# Patient Record
Sex: Male | Born: 2000 | Race: Black or African American | Hispanic: No | Marital: Single | Smoking: Never smoker
Health system: Southern US, Community
[De-identification: ages and names within clinical notes are randomized; demographics above are authoritative.]

---
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
## 2022-01-02 ENCOUNTER — Ambulatory Visit (HOSPITAL_COMMUNITY): Payer: Medicaid Other | Attending: Orthopedic Surgery | Admitting: Occupational Therapy

## 2022-01-02 ENCOUNTER — Encounter (HOSPITAL_COMMUNITY): Payer: Self-pay | Admitting: Occupational Therapy

## 2022-01-02 DIAGNOSIS — R29898 Other symptoms and signs involving the musculoskeletal system: Secondary | ICD-10-CM | POA: Diagnosis not present

## 2022-01-02 DIAGNOSIS — M25512 Pain in left shoulder: Secondary | ICD-10-CM | POA: Diagnosis not present

## 2022-01-02 DIAGNOSIS — M25612 Stiffness of left shoulder, not elsewhere classified: Secondary | ICD-10-CM | POA: Diagnosis not present

## 2022-01-02 NOTE — Therapy (Signed)
OUTPATIENT OCCUPATIONAL THERAPY ORTHO EVALUATION  Patient Name: Jack Horn MRN: [id number] DOB:Nov 09, 2000, 21 y.o., male Today's Date: 01/02/2022  PCP: Dr. Johny Drilling REFERRING PROVIDER: Dr. Fuller Canada   OT End of Session - 01/02/22 1047     Visit Number 1    Number of Visits 8    Date for OT Re-Evaluation 02/01/22    Authorization Type [place] Medicaid    Authorization Time Period requesting 8 visits    Authorization - Visit Number 0    Authorization - Number of Visits 8    OT Start Time [phone]    OT Stop Time 1026    OT Time Calculation (min) 38 min    Activity Tolerance Patient tolerated treatment well    Behavior During Therapy [facility] for tasks assessed/performed             History reviewed. No pertinent past medical history. History reviewed. No pertinent surgical history. There are no problems to display for this patient.   ONSET DATE: 12/07/21  REFERRING DIAG: s/p closed dislocation of left shoulder  THERAPY DIAG:  Acute pain of left shoulder  Stiffness of left shoulder, not elsewhere classified  Other symptoms and signs involving the musculoskeletal system  Rationale for Evaluation and Treatment Rehabilitation  SUBJECTIVE:   SUBJECTIVE STATEMENT: S: I got out of the sling two days ago and now I have this brace to wear if I'm doing exercises.  Pt accompanied by: self  PERTINENT HISTORY: Pt is a 21 y/o male s/p left shoulder dislocation that he sustained on 6/17 while at football camp at the [facility], it was reduced on the field by an Event organiser. Pt has been in a sling for 4 weeks, has not attempted to use his LUE for anything other than waist height tasks.   PRECAUTIONS: None *Note: avoid abducted er/IR*  WEIGHT BEARING RESTRICTIONS No  PAIN:  Are you having pain? Yes: NPRS scale: 7/10 Pain location: posterior shoulder Pain description: sharp Aggravating factors: laying on back Relieving factors: ice  FALLS: Has patient  fallen in last 6 months? No   PLOF: Independent  PATIENT GOALS To return to football.  OBJECTIVE:   HAND DOMINANCE: Left  ADLs: Overall ADLs: Pt reports he has not tried to reach behind his back, is completing tasks at waist level. 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 Pt is having difficulty with functional reaching tasks, overhead reaching and reaching behind back. Pt is unable to complete tasks requiring force through his LUE. Pt noted to have moderate fascial restrictions in upper trapezius and scapular regions.    FUNCTIONAL OUTCOME MEASURES: Quick Dash: 45.45   UPPER EXTREMITY ROM     Active ROM Left eval  Shoulder flexion 116  Shoulder abduction 74  Shoulder internal rotation 90  Shoulder external rotation 60  (Blank rows = not tested)      Passive ROM Left eval  Shoulder flexion 134  Shoulder abduction 133  Shoulder internal rotation 90  Shoulder external rotation 75  (Blank rows = not tested)  UPPER EXTREMITY MMT:     MMT Left eval  Shoulder flexion 4-/5  Shoulder abduction 3-/5  Shoulder internal rotation 5/5  Shoulder external rotation 4/5  (Blank rows = not tested)  HAND FUNCTION: Grip strength: Right: 115 lbs; Left: 65 lbs    COGNITION: Overall cognitive status: Within functional limits for tasks assessed      PATIENT EDUCATION: Education details: AA/ROM and A/ROM for the shoulder, green theraputty  for grip strengthening Person educated: Patient Education method: Explanation, Demonstration, and Handouts Education comprehension: verbalized understanding and returned demonstration   HOME EXERCISE PROGRAM: Eval: AA/ROM-flexion/abduction/horizontal abduction, A/ROM protraction/er, green theraputty grip strengthening  GOALS: Goals reviewed with patient? Yes  SHORT TERM GOALS: Target date: 01/30/2022    Pt will be provided with and educated on HEP to improve ability to use LUE as dominant during ADL and leisure tasks.    Goal status: INITIAL  2.  Pt will decrease pain in LUE to 3/10 or less to improve ability to sleep in position of comfort for 3+ hours without waking due to pain.   Goal status: INITIAL  3.  Pt will decrease fascial restrictions from mod to min amounts or less to improve mobility required for functional reaching tasks.   Goal status: INITIAL  4.  Pt will increase LUE A/ROM to WNL to improve mobility required for throwing and football.   Goal status: INITIAL  5.  Pt will increase LUE strength to 5/5 or greater to improve ability to perform receiver and corner tasks during football with least risk of re-injury.   Goal status: INITIAL    ASSESSMENT:  CLINICAL IMPRESSION: Patient is a 21 y.o. male who was seen today for occupational therapy evaluation s/p LUE dislocation.    PERFORMANCE DEFICITS in functional skills including ADLs, IADLs, ROM, strength, pain, fascial restrictions, muscle spasms, endurance, and UE functional use  IMPAIRMENTS are limiting patient from ADLs, IADLs, rest and sleep, and leisure.   COMORBIDITIES has no other co-morbidities that affects occupational performance. Patient will benefit from skilled OT to address above impairments and improve overall function.  MODIFICATION OR ASSISTANCE TO COMPLETE EVALUATION: No modification of tasks or assist necessary to complete an evaluation.  OT OCCUPATIONAL PROFILE AND HISTORY: Problem focused assessment: Including review of records relating to presenting problem.  CLINICAL DECISION MAKING: LOW - limited treatment options, no task modification necessary  REHAB POTENTIAL: Excellent  EVALUATION COMPLEXITY: Low      PLAN: OT FREQUENCY: 2x/week  OT DURATION: 4 weeks  PLANNED INTERVENTIONS: self care/ADL training, therapeutic exercise, therapeutic activity, passive range of motion, electrical stimulation, ultrasound, moist heat, cryotherapy, and patient/family education   CONSULTED AND AGREED WITH PLAN OF  CARE: Patient  PLAN FOR NEXT SESSION: Follow up on HEP, manual techniques/trigger point release at trapezius/scapular region, P/ROM, progress to all A/ROM if able   [employer], OTR/L  [phone] 01/02/2022, 10:48 AM

## 2022-01-02 NOTE — Patient Instructions (Signed)
Perform each exercise ____10-15____ reps. 2-3x days.   1) Shoulder FLEXION   In the standing position, hold a wand/cane with both arms, palms down on both sides. Raise up the wand/cane allowing your unaffected arm to perform most of the effort. Your affected arm should be partially relaxed.         2) Shoulder ABDUCTION   While holding a wand/cane palm face up on the injured side and palm face down on the uninjured side, slowly raise up your injured arm to the side.        3) Horizontal Abduction/Adduction   Straight arms holding cane at shoulder height, bring cane to right, center, left. Repeat starting to left.   4) Shoulder Protraction    Begin with elbows by your side, slowly "punch" straight out in front of you.       5) Internal & External Rotation  Standing:     Stand with elbows at the side and elbows bent 90 degrees. Move your forearms away from your body, then bring back inward toward the body.            Home Exercises Program Theraputty Exercises  Do the following exercises 2 times a day using your affected hand.  1. Roll putty into a ball.  2. Make into a pancake.  3. Roll putty into a roll.  4. Pinch along log with first finger and thumb.   5. Make into a ball.  6. Roll it back into a log.   7. Pinch using thumb and side of first finger.  8. Roll into a ball, then flatten into a pancake.  9. Using your fingers, make putty into a mountain.  10. Roll putty back into a ball and squeeze gently for 2-3 minutes.

## 2022-01-06 ENCOUNTER — Ambulatory Visit (HOSPITAL_COMMUNITY): Payer: Medicaid Other | Attending: Pediatrics

## 2022-01-06 ENCOUNTER — Telehealth (HOSPITAL_COMMUNITY): Payer: Self-pay

## 2022-01-06 DIAGNOSIS — M25612 Stiffness of left shoulder, not elsewhere classified: Secondary | ICD-10-CM | POA: Insufficient documentation

## 2022-01-06 DIAGNOSIS — R29898 Other symptoms and signs involving the musculoskeletal system: Secondary | ICD-10-CM | POA: Insufficient documentation

## 2022-01-06 DIAGNOSIS — M25512 Pain in left shoulder: Secondary | ICD-10-CM | POA: Insufficient documentation

## 2022-01-06 NOTE — Telephone Encounter (Signed)
Called both numbers listed regarding no show apt. with no answer and unable to leave voicemail.

## 2022-01-08 ENCOUNTER — Encounter (HOSPITAL_COMMUNITY): Payer: Self-pay

## 2022-01-08 ENCOUNTER — Ambulatory Visit (HOSPITAL_COMMUNITY): Payer: Medicaid Other

## 2022-01-08 DIAGNOSIS — M25512 Pain in left shoulder: Secondary | ICD-10-CM | POA: Diagnosis not present

## 2022-01-08 DIAGNOSIS — M25612 Stiffness of left shoulder, not elsewhere classified: Secondary | ICD-10-CM | POA: Diagnosis not present

## 2022-01-08 DIAGNOSIS — R29898 Other symptoms and signs involving the musculoskeletal system: Secondary | ICD-10-CM

## 2022-01-08 NOTE — Therapy (Signed)
OUTPATIENT OCCUPATIONAL THERAPY TREATMENT NOTE   Patient Name: Jack Horn MRN: [id number] DOB:05-13-2001, 21 y.o., male Today's Date: 01/08/2022  PCP: Dr. Johny Drilling REFERRING PROVIDER: Dr. Fuller Canada   OT End of Session - 01/08/22 1033     Visit Number 2    Number of Visits 8    Date for OT Re-Evaluation 02/01/22    Authorization Type HB Medicaid    Authorization Time Period requesting 8 visits    Authorization - Visit Number 1    Authorization - Number of Visits 8    OT Start Time 0950    OT Stop Time 1030    OT Time Calculation (min) 40 min    Activity Tolerance Patient tolerated treatment well    Behavior During Therapy [facility] for tasks assessed/performed             History reviewed. No pertinent past medical history. History reviewed. No pertinent surgical history. There are no problems to display for this patient.   ONSET DATE: 12/07/21  REFERRING DIAG: s/p closed dislocation of left shoulder  THERAPY DIAG:  Acute pain of left shoulder  Stiffness of left shoulder, not elsewhere classified  Other symptoms and signs involving the musculoskeletal system  Rationale for Evaluation and Treatment Rehabilitation  PERTINENT HISTORY: Pt is a 21 y/o male s/p left shoulder dislocation that he sustained on 6/17 while at football camp at the [facility], it was reduced on the field by an Event organiser. Pt has been in a sling for 4 weeks, has not attempted to use his LUE for anything other than waist height tasks.   PRECAUTIONS: None *Note: avoid abducted er/IR*  SUBJECTIVE: S: It feels pretty good, just weak. I am wearing my sling about 50% of the day.  PAIN:  Are you having pain? Yes: NPRS scale: 1/10 Pain location: anterior shoulder Pain description: dull ache Aggravating factors: none Relieving factors: exercises     OBJECTIVE:   HAND DOMINANCE: Left   ADLs: Overall ADLs: Pt reports he has not tried to reach behind his back, is  completing tasks at waist level. 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 Pt is having difficulty with functional reaching tasks, overhead reaching and reaching behind back. Pt is unable to complete tasks requiring force through his LUE. Pt noted to have moderate fascial restrictions in upper trapezius and scapular regions.      FUNCTIONAL OUTCOME MEASURES: Quick Dash: 45.45     UPPER EXTREMITY ROM      Active ROM Left eval  Shoulder flexion 116  Shoulder abduction 74  Shoulder internal rotation 90  Shoulder external rotation 60  (Blank rows = not tested)                              Passive ROM Left eval  Shoulder flexion 134  Shoulder abduction 133  Shoulder internal rotation 90  Shoulder external rotation 75  (Blank rows = not tested)   UPPER EXTREMITY MMT:      MMT Left eval  Shoulder flexion 4-/5  Shoulder abduction 3-/5  Shoulder internal rotation 5/5  Shoulder external rotation 4/5  (Blank rows = not tested)   HAND FUNCTION: Grip strength: Right: 115 lbs; Left: 65 lbs      GOALS: Goals reviewed with patient? Yes   SHORT TERM GOALS: Target date: 01/30/2022     Pt will be provided with and educated on HEP to improve  ability to use LUE as dominant during ADL and leisure tasks.    Goal status: INITIAL   2.  Pt will decrease pain in LUE to 3/10 or less to improve ability to sleep in position of comfort for 3+ hours without waking due to pain.    Goal status: INITIAL   3.  Pt will decrease fascial restrictions from mod to min amounts or less to improve mobility required for functional reaching tasks.    Goal status: INITIAL   4.  Pt will increase LUE A/ROM to WNL to improve mobility required for throwing and football.    Goal status: INITIAL   5.  Pt will increase LUE strength to 5/5 or greater to improve ability to perform receiver and corner tasks during football with least risk of re-injury.    Goal status: INITIAL      TODAY'S  TREATMENT:   -Manual Therapy: Trigger point, soft tissue mobilization, and myofascial release completed to the upper trapezius, posterior scapula, and scalenes -P/ROM: 1x10 shoulder flexion, abduction, IR/er, horizontal abduction -AA/ROM: 1x10 shoulder flexion, abduction IR/er -Scapular ROM: 1x10 standing row, extension, retraction, and Y standing facing the wall -Scapular ROM: 1x10, standing at wall with shoulders at 90 degrees, alternating between protraction and retraction.  -Shoulder strengthening: Pt standing, holding green exercise ball, completing movements with therapist providing slight resistance to the therapy ball. 1x10 flexion and abduction.  -Shoulder stabilization: 1x30 second plank   PATIENT EDUCATION: Education details: scapular ROM  Person educated: Patient Education method: Explanation, Demonstration, and Handouts Education comprehension: verbalized understanding and returned demonstration     HOME EXERCISE PROGRAM: Eval: AA/ROM-flexion/abduction/horizontal abduction, A/ROM protraction/er, green theraputty grip strengthening    ASSESSMENT:   CLINICAL IMPRESSION: A:Pt presenting with improved shoulder passive and active range of motion from evaluation date, reporting no pain with movement, some slight discomfort. Fluid scapular movements noted with moderate initial tactile cues required for proper muscle recruitment with scapular range of motion exercises. Pt reports that his shoulder stabilizer muscles feel weak but not painful. Continue to progress to strengthening as tolerated to stabilize musculature surrounding joint.    PLAN:   OT FREQUENCY: 2x/week  OT DURATION: 4 weeks  PLANNED INTERVENTIONS: self care/ADL training, therapeutic exercise, therapeutic activity, passive range of motion, electrical stimulation, ultrasound, moist heat, cryotherapy, and patient/family education  RECOMMENDED OTHER SERVICES: Skilled OT services are warranted to address decreased  ROM and strength impacting the pt's ability to complete ADLs and IADLs at his PLOF.   CONSULTED AND AGREED WITH PLAN OF CARE: Patient  PLAN FOR NEXT SESSION: P: Progress to strengthening    Perley Jain, OTD, OTR/L [phone]  01/08/2022, 11:25 AM

## 2022-01-14 ENCOUNTER — Ambulatory Visit (HOSPITAL_COMMUNITY): Payer: Medicaid Other

## 2022-01-14 ENCOUNTER — Encounter (HOSPITAL_COMMUNITY): Payer: Self-pay

## 2022-01-14 DIAGNOSIS — R29898 Other symptoms and signs involving the musculoskeletal system: Secondary | ICD-10-CM

## 2022-01-14 DIAGNOSIS — M25612 Stiffness of left shoulder, not elsewhere classified: Secondary | ICD-10-CM

## 2022-01-14 DIAGNOSIS — M25512 Pain in left shoulder: Secondary | ICD-10-CM

## 2022-01-14 NOTE — Therapy (Signed)
OUTPATIENT OCCUPATIONAL THERAPY TREATMENT NOTE   Patient Name: Jack Horn MRN: [id number] DOB:01/07/2001, 21 y.o., male Today's Date: 01/14/2022  PCP: Dr. Johny Drilling REFERRING PROVIDER: Dr. Fuller [place]   OT End of Session - 01/14/22 1029     Visit Number 3    Number of Visits 8    Date for OT Re-Evaluation 02/01/22    Authorization Type HB Medicaid    Authorization Time Period requesting 8 visits    Authorization - Visit Number 2    Authorization - Number of Visits 8    OT Start Time 1027    OT Stop Time 1111    OT Time Calculation (min) 44 min    Activity Tolerance Patient tolerated treatment well    Behavior During Therapy [facility] for tasks assessed/performed             History reviewed. No pertinent past medical history. History reviewed. No pertinent surgical history. There are no problems to display for this patient.   ONSET DATE: 12/07/21  REFERRING DIAG: s/p closed dislocation of left shoulder  THERAPY DIAG:  Acute pain of left shoulder  Stiffness of left shoulder, not elsewhere classified  Other symptoms and signs involving the musculoskeletal system  Rationale for Evaluation and Treatment Rehabilitation  PERTINENT HISTORY: Pt is a 21 y/o male s/p left shoulder dislocation that he sustained on 6/17 while at football camp at the [facility], it was reduced on the field by an Event organiser. Pt has been in a sling for 4 weeks, has not attempted to use his LUE for anything other than waist height tasks.   PRECAUTIONS: None *Note: avoid abducted er/IR*  SUBJECTIVE: S: "It is feeling better. No real pain."  PAIN:  Are you having pain? No    OBJECTIVE:   HAND DOMINANCE: Left   ADLs: Overall ADLs: Pt reports he has not tried to reach behind his back, is completing tasks at waist level. 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 Pt is having difficulty with functional reaching tasks, overhead reaching and reaching behind  back. Pt is unable to complete tasks requiring force through his LUE. Pt noted to have moderate fascial restrictions in upper trapezius and scapular regions.      FUNCTIONAL OUTCOME MEASURES: Quick Dash: 45.45     UPPER EXTREMITY ROM      Active ROM Left eval  Shoulder flexion 116  Shoulder abduction 74  Shoulder internal rotation 90  Shoulder external rotation 60  (Blank rows = not tested)                              Passive ROM Left eval  Shoulder flexion 134  Shoulder abduction 133  Shoulder internal rotation 90  Shoulder external rotation 75  (Blank rows = not tested)   UPPER EXTREMITY MMT:      MMT Left eval  Shoulder flexion 4-/5  Shoulder abduction 3-/5  Shoulder internal rotation 5/5  Shoulder external rotation 4/5  (Blank rows = not tested)   HAND FUNCTION: Grip strength: Right: 115 lbs; Left: 65 lbs      GOALS: Goals reviewed with patient? Yes   SHORT TERM GOALS: Target date: 01/30/2022     Pt will be provided with and educated on HEP to improve ability to use LUE as dominant during ADL and leisure tasks.    Goal status: INITIAL   2.  Pt will decrease pain in LUE  to 3/10 or less to improve ability to sleep in position of comfort for 3+ hours without waking due to pain.    Goal status: INITIAL   3.  Pt will decrease fascial restrictions from mod to min amounts or less to improve mobility required for functional reaching tasks.    Goal status: INITIAL   4.  Pt will increase LUE A/ROM to WNL to improve mobility required for throwing and football.    Goal status: INITIAL   5.  Pt will increase LUE strength to 5/5 or greater to improve ability to perform receiver and corner tasks during football with least risk of re-injury.    Goal status: INITIAL      TODAY'S TREATMENT:    01/14/22 -Manual Therapy: Trigger point targeting medial border of scapula and upper trapezius  -Shoulder/Scapular Strengthening  -1x10 exercise ball on wall (up,  down, left, right)  -1x10 exercise ball on wall (CW and CCW)  -2x10 standing IR/er 2#  -1x10 arnold press 4#  -1x10 wall angels with bodyweight, 1x10 2# -2x10 red theraband loop forward flexion, overhead scapular downward rotation facing wall -1x10 red theraband standing row, extension  -1x10 red theraband standing forward flexion, abduction, chest pull, FNF D2 flexion -1x5 shoulder isometrics (flexion, extension, abduction) 5 second hold   01/08/22 -Manual Therapy: Trigger point, soft tissue mobilization, and myofascial release completed to the upper trapezius, posterior scapula, and scalenes -P/ROM: 1x10 shoulder flexion, abduction, IR/er, horizontal abduction -AA/ROM: 1x10 shoulder flexion, abduction IR/er -Scapular ROM: 1x10 standing row, extension, retraction, and Y standing facing the wall -Scapular ROM: 1x10, standing at wall with shoulders at 90 degrees, alternating between protraction and retraction.  -Shoulder strengthening: Pt standing, holding green exercise ball, completing movements with therapist providing slight resistance to the therapy ball. 1x10 flexion and abduction.  -Shoulder stabilization: 1x30 second plank   PATIENT EDUCATION: Education details: TheraBand shoulder and scapular strengthening  Person educated: Patient Education method: Explanation, Demonstration, and Handouts Education comprehension: verbalized understanding and returned demonstration     HOME EXERCISE PROGRAM: Eval: AA/ROM-flexion/abduction/horizontal abduction, A/ROM protraction/er, green theraputty grip strengthening 01/14/22: Red theraband scapular and shoulder strengthening     ASSESSMENT:   CLINICAL IMPRESSION: A:Pt presenting with no shoulder pain and ROM WNL. Trigger point manual therapy to address restrictions along the medial border of the scapula. Pt required moderate tactile cuing for form with exercises, emphasizing form over speed and resistance. Pt reporting no pain with  movements but moderate fatigue. Scapular imbalances noted with exercises, therapist cuing for depression of left shoulder blade to minimize compensations.   PLAN:   OT FREQUENCY: 2x/week  OT DURATION: 4 weeks  PLANNED INTERVENTIONS: self care/ADL training, therapeutic exercise, therapeutic activity, passive range of motion, electrical stimulation, ultrasound, moist heat, cryotherapy, and patient/family education  RECOMMENDED OTHER SERVICES: Skilled OT services are warranted to address decreased ROM and strength impacting the pt's ability to complete ADLs and IADLs at his PLOF.   CONSULTED AND AGREED WITH PLAN OF CARE: Patient  PLAN FOR NEXT SESSION: P: arm bike, plank    Perley Jain, [place], OTR/L [phone]  01/14/2022, 11:27 AM

## 2022-01-14 NOTE — Patient Instructions (Signed)
  1) Shoulder flexion  While standing with back to the door, holding Theraband at hand level, raise arm in front of you.  Keep elbow straight through entire movement.      2) Shoulder abduction  While holding an elastic band at your side, draw up your arm to the side keeping your elbow straight.    3) Strengthening: Chest Pull - Resisted   Hold Theraband in front of body with hands about shoulder width a part. Pull band a part and back together slowly. Repeat ____ times. Complete ____ set(s) per session.. Repeat ____ session(s) per day.  http://orth.exer.us/926   Copyright  VHI. All rights reserved.   4) PNF Strengthening: Resisted   Standing with resistive band around each hand, bring right arm up and away, thumb back. Repeat ____ times per set. Do ____ sets per session. Do ____ sessions per day.   Shoulder Isometric Exercises  Complete 5x each, holding for 5 seconds. Complete 2-3x/day.   1) Shoulder Flexion Standing up, place a towel between your arm/wrist and the wall. Press your arm forward into the wall gently, without moving your shoulder.     2) Shoulder Extension Standing with your back against a wall. Bend elbow to 90 degrees and place a towel between your bent elbow and the wall.  Press your elbow back into the wall gently, without moving your shoulder.      3) Shoulder Abduction Standing to the side of a wall, place a towel between your arm and the wall.  Press your arm into the wall gently.  Do not move your shoulder.        4) (Clinic) Extension / Flexion (Assist)   Face anchor, pull arms back, keeping elbow straight, and squeze shoulder blades together. Repeat 10-15 times. 1-3 times/day.   Copyright  VHI. All rights reserved.   5) (Home) Retraction: Row - Bilateral (Anchor)   Facing anchor, arms reaching forward, pull hands toward stomach, keeping elbows bent and at your sides and pinching shoulder blades together. Repeat 10-15 times.  1-3 times/day.   Copyright  VHI. All rights reserved.

## 2022-01-16 ENCOUNTER — Telehealth (HOSPITAL_COMMUNITY): Payer: Self-pay | Admitting: Occupational Therapy

## 2022-01-16 ENCOUNTER — Encounter (HOSPITAL_COMMUNITY): Payer: Medicaid Other | Admitting: Occupational Therapy

## 2022-01-16 NOTE — Telephone Encounter (Signed)
Attempted to contact pt regarding no show for today. No answer, voicemail not set up and unable to leave a message.    Ezra Sites, OTR/L  [phone] 01/16/22

## 2022-01-21 ENCOUNTER — Telehealth (HOSPITAL_COMMUNITY): Payer: Self-pay

## 2022-01-21 ENCOUNTER — Encounter (HOSPITAL_COMMUNITY): Payer: Medicaid Other

## 2022-01-21 NOTE — Telephone Encounter (Signed)
Called pt regarding no show apt. No answer, unable to leave a voicemail.

## 2022-01-23 ENCOUNTER — Encounter (HOSPITAL_COMMUNITY): Payer: Self-pay

## 2022-01-23 ENCOUNTER — Ambulatory Visit (HOSPITAL_COMMUNITY): Payer: Medicaid Other | Attending: Pediatrics

## 2022-01-23 DIAGNOSIS — M25612 Stiffness of left shoulder, not elsewhere classified: Secondary | ICD-10-CM | POA: Diagnosis not present

## 2022-01-23 DIAGNOSIS — M25512 Pain in left shoulder: Secondary | ICD-10-CM

## 2022-01-23 DIAGNOSIS — R29898 Other symptoms and signs involving the musculoskeletal system: Secondary | ICD-10-CM

## 2022-01-23 NOTE — Patient Instructions (Signed)
1) (Home) Extension: Isometric / Bilateral Arm Retraction - Sitting   Facing anchor, hold hands and elbow at shoulder height, with elbow bent.  Pull arms back to squeeze shoulder blades together. Repeat 10-15 times. 1-3 times/day.   2) (Clinic) Extension / Flexion (Assist)   Face anchor, pull arms back, keeping elbow straight, and squeze shoulder blades together. Repeat 10-15 times. 1-3 times/day.   Copyright  VHI. All rights reserved.   3) (Home) Retraction: Row - Bilateral (Anchor)   Facing anchor, arms reaching forward, pull hands toward stomach, keeping elbows bent and at your sides and pinching shoulder blades together. Repeat 10-15 times. 1-3 times/day.   Copyright  VHI. All rights reserved.    Theraband strengthening: Complete 10-15X, 1-2X/day   1) Shoulder Internal Rotation  While holding an elastic band at your side with your elbow bent, start with your hand away from your stomach, then pull the band towards your stomach. Keep your elbow near your side the entire time.     2) Shoulder External Rotation  While holding an elastic band at your side with your elbow bent, start with your hand near your stomach and then pull the band away. Keep your elbow at your side the entire time.     3) Shoulder flexion  While standing with back to the door, holding Theraband at hand level, raise arm in front of you.  Keep elbow straight through entire movement.      4) Shoulder abduction  While holding an elastic band at your side, draw up your arm to the side keeping your elbow straight.    1) Strengthening: Chest Pull - Resisted   Hold Theraband in front of body with hands about shoulder width a part. Pull band a part and back together slowly. Repeat ____ times. Complete ____ set(s) per session.. Repeat ____ session(s) per day.  http://orth.exer.us/926   Copyright  VHI. All rights reserved.   2) PNF Strengthening: Resisted   Standing with resistive band  around each hand, bring right arm up and away, thumb back. Repeat ____ times per set. Do ____ sets per session. Do ____ sessions per day.

## 2022-01-23 NOTE — Therapy (Signed)
OUTPATIENT OCCUPATIONAL THERAPY TREATMENT NOTE   Patient Name: Jack Horn MRN: [id number] DOB:21-Aug-2000, 21 y.o., male Today's Date: 01/23/2022  PCP: Dr. Johny Drilling REFERRING PROVIDER: Dr. Fuller Canada   OT End of Session - 01/23/22 1147     Visit Number 4    Number of Visits 8    Date for OT Re-Evaluation 02/01/22    Authorization Type HB Medicaid    Authorization Time Period requesting 8 visits    Authorization - Visit Number 3    Authorization - Number of Visits 8    OT Start Time 1043    OT Stop Time 1121    OT Time Calculation (min) 38 min    Activity Tolerance Patient tolerated treatment well    Behavior During Therapy [facility] for tasks assessed/performed              History reviewed. No pertinent past medical history. History reviewed. No pertinent surgical history. There are no problems to display for this patient.   ONSET DATE: 12/07/21  REFERRING DIAG: s/p closed dislocation of left shoulder  THERAPY DIAG:  Acute pain of left shoulder  Stiffness of left shoulder, not elsewhere classified  Other symptoms and signs involving the musculoskeletal system  Rationale for Evaluation and Treatment Rehabilitation  PERTINENT HISTORY: Pt is a 21 y/o male s/p left shoulder dislocation that he sustained on 6/17 while at football camp at the [facility], it was reduced on the field by an Event organiser. Pt has been in a sling for 4 weeks, has not attempted to use his LUE for anything other than waist height tasks.   PRECAUTIONS: None *Note: avoid abducted er/IR*  SUBJECTIVE: S: "I feel like it is getting stronger."   PAIN:  Are you having pain? No    Some discomfort at the posterior shoulder   OBJECTIVE:   HAND DOMINANCE: Left   ADLs: Overall ADLs: Pt reports he has not tried to reach behind his back, is completing tasks at waist level. 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 Pt is having difficulty with functional reaching  tasks, overhead reaching and reaching behind back. Pt is unable to complete tasks requiring force through his LUE. Pt noted to have moderate fascial restrictions in upper trapezius and scapular regions.      FUNCTIONAL OUTCOME MEASURES: Quick Dash: 45.45     UPPER EXTREMITY ROM      Active ROM Left eval  Shoulder flexion 116  Shoulder abduction 74  Shoulder internal rotation 90  Shoulder external rotation 60  (Blank rows = not tested)                              Passive ROM Left eval  Shoulder flexion 134  Shoulder abduction 133  Shoulder internal rotation 90  Shoulder external rotation 75  (Blank rows = not tested)   UPPER EXTREMITY MMT:      MMT Left eval  Shoulder flexion 4-/5  Shoulder abduction 3-/5  Shoulder internal rotation 5/5  Shoulder external rotation 4/5  (Blank rows = not tested)   HAND FUNCTION: Grip strength: Right: 115 lbs; Left: 65 lbs      GOALS: Goals reviewed with patient? Yes   SHORT TERM GOALS: Target date: 01/30/2022     Pt will be provided with and educated on HEP to improve ability to use LUE as dominant during ADL and leisure tasks.    Goal status: ongoing  2.  Pt will decrease pain in LUE to 3/10 or less to improve ability to sleep in position of comfort for 3+ hours without waking due to pain.    Goal status: ongoing    3.  Pt will decrease fascial restrictions from mod to min amounts or less to improve mobility required for functional reaching tasks.    Goal status: ongoing    4.  Pt will increase LUE A/ROM to WNL to improve mobility required for throwing and football.    Goal status: ongoing    5.  Pt will increase LUE strength to 5/5 or greater to improve ability to perform receiver and corner tasks during football with least risk of re-injury.    Goal status: ongoing       TODAY'S TREATMENT:     01/23/22 -Manual Therapy: Trigger point targeting medial border of scapula and upper trapezius  -Shoulder/Scapular  Strengthening -1x30" exercise ball stabilization. Pt holding exercise ball in forward flexion, 90 degrees, therapist moving ball in various directions with pt instructed to hold ball still   -2x10 standing IR/er 4#  -1x10 arnold press 4# -1x10 red theraband standing chest pull, forward flexion with tension -Body blade, 45 sec, elbow in flexion, elbow in extension, forward flexion, abduction     -Plank with crossing midline to retrieve 4# weight, 2x30 sec   01/14/22 -Manual Therapy: Trigger point targeting medial border of scapula and upper trapezius  -Shoulder/Scapular Strengthening  -1x10 exercise ball on wall (up, down, left, right)  -1x10 exercise ball on wall (CW and CCW)  -2x10 standing IR/er 2#  -1x10 arnold press 4#  -1x10 wall angels with bodyweight, 1x10 2# -2x10 red theraband loop forward flexion, overhead scapular downward rotation facing wall -1x10 red theraband standing row, extension  -1x10 red theraband standing forward flexion, abduction, chest pull, FNF D2 flexion -1x5 shoulder isometrics (flexion, extension, abduction) 5 second hold  01/08/22 -Manual Therapy: Trigger point, soft tissue mobilization, and myofascial release completed to the upper trapezius, posterior scapula, and scalenes -P/ROM: 1x10 shoulder flexion, abduction, IR/er, horizontal abduction -AA/ROM: 1x10 shoulder flexion, abduction IR/er -Scapular ROM: 1x10 standing row, extension, retraction, and Y standing facing the wall -Scapular ROM: 1x10, standing at wall with shoulders at 90 degrees, alternating between protraction and retraction.  -Shoulder strengthening: Pt standing, holding green exercise ball, completing movements with therapist providing slight resistance to the therapy ball. 1x10 flexion and abduction.  -Shoulder stabilization: 1x30 second plank   PATIENT EDUCATION: Education details: Reviewed HEP Person educated: Patient Education method: Explanation, Demonstration, and  Handouts Education comprehension: verbalized understanding and returned demonstration     HOME EXERCISE PROGRAM: Eval: AA/ROM-flexion/abduction/horizontal abduction, A/ROM protraction/er, green theraputty grip strengthening 01/14/22: Red theraband scapular and shoulder strengthening     ASSESSMENT:   CLINICAL IMPRESSION: A:Pt presenting with no shoulder pain but some discomfort at the posterior shoulder. Multiple knots and fascial restrictions found throughout posterior shoulder, along medial border of the scapula and upper trapezius, responding well to manual trigger point and soft tissue. Less fatigue noted with exercises today, pt able to complete with good form throughout although requiring therapeutic rest breaks. Therapist providing minimal verbal cues for keeping left shoulder level with right shoulder. Discussed attendance policy with pt, informing him that with one more no show he will need to be discharged.   PLAN:   OT FREQUENCY: 2x/week  OT DURATION: 4 weeks  PLANNED INTERVENTIONS: self care/ADL training, therapeutic exercise, therapeutic activity, passive range of motion, electrical stimulation, ultrasound, moist heat,  cryotherapy, and patient/family education  RECOMMENDED OTHER SERVICES: Skilled OT services are warranted to address decreased ROM and strength impacting the pt's ability to complete ADLs and IADLs at his PLOF.   CONSULTED AND AGREED WITH PLAN OF CARE: Patient  PLAN FOR NEXT SESSION: P: arm bike, plank, continue with stabilization exercises    Freda Jackson, OTR/L [phone]  01/23/2022, 11:48 AM

## 2022-01-28 ENCOUNTER — Ambulatory Visit (HOSPITAL_COMMUNITY): Payer: Medicaid Other

## 2022-01-28 ENCOUNTER — Encounter (HOSPITAL_COMMUNITY): Payer: Self-pay

## 2022-01-28 DIAGNOSIS — M25612 Stiffness of left shoulder, not elsewhere classified: Secondary | ICD-10-CM

## 2022-01-28 DIAGNOSIS — R29898 Other symptoms and signs involving the musculoskeletal system: Secondary | ICD-10-CM

## 2022-01-28 DIAGNOSIS — M25512 Pain in left shoulder: Secondary | ICD-10-CM | POA: Diagnosis not present

## 2022-01-28 NOTE — Therapy (Signed)
OUTPATIENT OCCUPATIONAL THERAPY TREATMENT NOTE   Patient Name: Jack Horn MRN: [id number] DOB:[birth year], 21 y.o., male Today's Date: 01/28/2022  PCP: Dr. Johny Drilling REFERRING PROVIDER: Dr. Fuller [place]   OT End of Session - 01/28/22 0947     Visit Number 5    Number of Visits 8    Date for OT Re-Evaluation 02/01/22    Authorization Type HB Medicaid    Authorization Time Period requesting 8 visits    Authorization - Visit Number 4    Authorization - Number of Visits 8    OT Start Time 0945    OT Stop Time 1025    OT Time Calculation (min) 40 min    Activity Tolerance Patient tolerated treatment well    Behavior During Therapy [facility] for tasks assessed/performed              History reviewed. No pertinent past medical history. History reviewed. No pertinent surgical history. There are no problems to display for this patient.   ONSET DATE: 12/07/21  REFERRING DIAG: s/p closed dislocation of left shoulder  THERAPY DIAG:  Acute pain of left shoulder  Stiffness of left shoulder, not elsewhere classified  Other symptoms and signs involving the musculoskeletal system  Rationale for Evaluation and Treatment Rehabilitation  PERTINENT HISTORY: Pt is a 21 y/o male s/p left shoulder dislocation that he sustained on 6/17 while at football camp at the [facility], it was reduced on the field by an Event organiser. Pt has been in a sling for 4 weeks, has not attempted to use his LUE for anything other than waist height tasks.   PRECAUTIONS: None *Note: avoid abducted er/IR*  SUBJECTIVE: S: "It has been feeling good. I can do everything that I need to do."  PAIN:  Are you having pain? No    Some discomfort at the posterior shoulder   OBJECTIVE:   HAND DOMINANCE: Left   ADLs: Overall ADLs: Pt reports he has not tried to reach behind his back, is completing tasks at waist level. 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 Pt is having  difficulty with functional reaching tasks, overhead reaching and reaching behind back. Pt is unable to complete tasks requiring force through his LUE. Pt noted to have moderate fascial restrictions in upper trapezius and scapular regions.      FUNCTIONAL OUTCOME MEASURES: Quick Dash: 45.45     UPPER EXTREMITY ROM      Active ROM Left eval  Shoulder flexion 116  Shoulder abduction 74  Shoulder internal rotation 90  Shoulder external rotation 60  (Blank rows = not tested)                              Passive ROM Left eval  Shoulder flexion 134  Shoulder abduction 133  Shoulder internal rotation 90  Shoulder external rotation 75  (Blank rows = not tested)   UPPER EXTREMITY MMT:      MMT Left eval  Shoulder flexion 4-/5  Shoulder abduction 3-/5  Shoulder internal rotation 5/5  Shoulder external rotation 4/5  (Blank rows = not tested)   HAND FUNCTION: Grip strength: Right: 115 lbs; Left: 65 lbs      GOALS: Goals reviewed with patient? Yes   SHORT TERM GOALS: Target date: 01/30/2022     Pt will be provided with and educated on HEP to improve ability to use LUE as dominant during ADL and leisure tasks.  Goal status: ongoing    2.  Pt will decrease pain in LUE to 3/10 or less to improve ability to sleep in position of comfort for 3+ hours without waking due to pain.    Goal status: ongoing    3.  Pt will decrease fascial restrictions from mod to min amounts or less to improve mobility required for functional reaching tasks.    Goal status: ongoing    4.  Pt will increase LUE A/ROM to WNL to improve mobility required for throwing and football.    Goal status: ongoing    5.  Pt will increase LUE strength to 5/5 or greater to improve ability to perform receiver and corner tasks during football with least risk of re-injury.    Goal status: ongoing       TODAY'S TREATMENT:    01/28/22 -Manual Therapy: Trigger point targeting medial border of scapula and  upper trapezius  -Shoulder/Scapular Strengthening -Body blade, 45 sec, elbow in flexion, elbow in extension, forward flexion, abduction  -Overhead weighted walk: 3x45", 8# -[employer], 2x10, 8# -Standing IR/er, towel for slight abduction, 8# -Standing at wall, abducted IR/er, 1x7, 1x5, 2# -Plank with crossing midline to retrieve 4# weight, 2x45 sec -Red theraband standing chest pull, 1x10 -Red theraband forward flexion with tension, adduction at top, 1x10 -UBE: level 2, 3' forward, 2' reverse    01/23/22 -Manual Therapy: Trigger point targeting medial border of scapula and upper trapezius  -Shoulder/Scapular Strengthening -1x30" exercise ball stabilization. Pt holding exercise ball in forward flexion, 90 degrees, therapist moving ball in various directions with pt instructed to hold ball still   -2x10 standing IR/er 4#  -1x10 arnold press 4# -1x10 red theraband standing chest pull, forward flexion with tension -Body blade, 45 sec, elbow in flexion, elbow in extension, forward flexion, abduction     -Plank with crossing midline to retrieve 4# weight, 2x30 sec   01/14/22 -Manual Therapy: Trigger point targeting medial border of scapula and upper trapezius  -Shoulder/Scapular Strengthening  -1x10 exercise ball on wall (up, down, left, right)  -1x10 exercise ball on wall (CW and CCW)  -2x10 standing IR/er 2#  -1x10 arnold press 4#  -1x10 wall angels with bodyweight, 1x10 2# -2x10 red theraband loop forward flexion, overhead scapular downward rotation facing wall -1x10 red theraband standing row, extension  -1x10 red theraband standing forward flexion, abduction, chest pull, FNF D2 flexion -1x5 shoulder isometrics (flexion, extension, abduction) 5 second hold   PATIENT EDUCATION: Education details: Reviewed HEP Person educated: Patient Education method: Explanation, Demonstration, and Handouts Education comprehension: verbalized understanding and returned demonstration     HOME  EXERCISE PROGRAM: Eval: AA/ROM-flexion/abduction/horizontal abduction, A/ROM protraction/er, green theraputty grip strengthening 01/14/22: Red theraband scapular and shoulder strengthening     ASSESSMENT:   CLINICAL IMPRESSION: A: With pt demonstrating good form and scapular movement, progressing with more repetition and weight to increase endurance of shoulder musculature. Pt reporting some fatigue throughout, minimal verbal cues for form and positioning, but no pain. Trailed abducted IR/er to assess strength and form with movement with light weight, no pain, good form and strength. Pt reports feeling confident with HEP and feels that he is getting stronger, ready to return to football.    PLAN:   OT FREQUENCY: 2x/week  OT DURATION: 4 weeks  PLANNED INTERVENTIONS: self care/ADL training, therapeutic exercise, therapeutic activity, passive range of motion, electrical stimulation, ultrasound, moist heat, cryotherapy, and patient/family education  RECOMMENDED OTHER SERVICES: Skilled OT services are warranted to address decreased  ROM and strength impacting the pt's ability to complete ADLs and IADLs at his PLOF.   CONSULTED AND AGREED WITH PLAN OF CARE: Patient  PLAN FOR NEXT SESSION: P: ASSESS FOR DISCHARGE    Perley Jain, [place], OTR/L [phone]  01/28/2022, 10:27 AM

## 2022-01-30 ENCOUNTER — Ambulatory Visit (HOSPITAL_COMMUNITY): Payer: Medicaid Other

## 2022-01-30 ENCOUNTER — Encounter (HOSPITAL_COMMUNITY): Payer: Self-pay

## 2022-01-30 DIAGNOSIS — M25512 Pain in left shoulder: Secondary | ICD-10-CM

## 2022-01-30 DIAGNOSIS — R29898 Other symptoms and signs involving the musculoskeletal system: Secondary | ICD-10-CM

## 2022-01-30 DIAGNOSIS — M25612 Stiffness of left shoulder, not elsewhere classified: Secondary | ICD-10-CM | POA: Diagnosis not present

## 2022-01-30 NOTE — Therapy (Signed)
OUTPATIENT OCCUPATIONAL THERAPY TREATMENT NOTE   Patient Name: Jack Horn MRN: [id number] DOB:11-Dec-2000, 21 y.o., male Today's Date: 01/30/2022  PCP: Dr. Iven Finn REFERRING PROVIDER: Dr. Arther Abbott   OCCUPATIONAL THERAPY DISCHARGE SUMMARY  Visits from Start of Care: 6  Current functional level related to goals / functional outcomes: Pt has met all goals and reports being independent with all ADLs and IADLs with use of his dominant left shoulder   Remaining deficits: No remaining deficits.  Discussed continuation of HEP to ensure shoulder stability   Education / Equipment: HEP with theraband  Plan: Patient agrees to discharge.  Patient is being discharged due to meeting the stated rehab goals.          OT End of Session - 01/30/22 1051     Visit Number 6    Number of Visits 8    Date for OT Re-Evaluation 02/01/22    Authorization Type HB Medicaid    Authorization Time Period requesting 8 visits    Authorization - Visit Number 5    Authorization - Number of Visits 8    OT Start Time 0902    OT Stop Time 0941    OT Time Calculation (min) 39 min    Activity Tolerance Patient tolerated treatment well    Behavior During Therapy [facility] for tasks assessed/performed               History reviewed. No pertinent past medical history. History reviewed. No pertinent surgical history. There are no problems to display for this patient.   ONSET DATE: 12/07/21  REFERRING DIAG: s/p closed dislocation of left shoulder  THERAPY DIAG:  Acute pain of left shoulder  Stiffness of left shoulder, not elsewhere classified  Other symptoms and signs involving the musculoskeletal system  Rationale for Evaluation and Treatment Rehabilitation  PERTINENT HISTORY: Pt is a 21 y/o male s/p left shoulder dislocation that he sustained on 6/17 while at football camp at the [facility], it was reduced on the field by an Product/process development scientist. Pt has been in a sling  for 4 weeks, has not attempted to use his LUE for anything other than waist height tasks.   PRECAUTIONS: None *Note: avoid abducted er/IR*  SUBJECTIVE: S: "I feel good. It gets tired sometimes but that's all."  PAIN:  Are you having pain? No   OBJECTIVE:   HAND DOMINANCE: Left   ADLs: Overall ADLs: Pt reports he has not tried to reach behind his back, is completing tasks at waist level. 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 Pt is having difficulty with functional reaching tasks, overhead reaching and reaching behind back. Pt is unable to complete tasks requiring force through his LUE. Pt noted to have moderate fascial restrictions in upper trapezius and scapular regions.      FUNCTIONAL OUTCOME MEASURES: Quick Dash: 45.45 8/10: 4.55     UPPER EXTREMITY ROM      Active ROM Left eval Left  8/10  Shoulder flexion 116 175  Shoulder abduction 74 180  Shoulder internal rotation 90 90  Shoulder external rotation 60 86  (Blank rows = not tested)                              Passive ROM Left eval Left  8/10  Shoulder flexion 134 WNL  Shoulder abduction 133 WNL  Shoulder internal rotation 90 WNL  Shoulder external rotation 75 WNL  (Blank rows =  not tested)   UPPER EXTREMITY MMT:      MMT Left eval Left  8/10  Shoulder flexion 4-/5 5/5  Shoulder abduction 3-/5 5/5  Shoulder internal rotation 5/5 5/5  Shoulder external rotation 4/5 5/5  (Blank rows = not tested)   HAND FUNCTION: Grip strength: Right: 115 lbs; Left: 65 lbs 8/10: 131 lbs       GOALS: Goals reviewed with patient? Yes   SHORT TERM GOALS: Target date: 01/30/2022     Pt will be provided with and educated on HEP to improve ability to use LUE as dominant during ADL and leisure tasks.    Goal status: MET     2.  Pt will decrease pain in LUE to 3/10 or less to improve ability to sleep in position of comfort for 3+ hours without waking due to pain.    Goal status: MET     3.  Pt will  decrease fascial restrictions from mod to min amounts or less to improve mobility required for functional reaching tasks.    Goal status: MET     4.  Pt will increase LUE A/ROM to WNL to improve mobility required for throwing and football.    Goal status: MET     5.  Pt will increase LUE strength to 5/5 or greater to improve ability to perform receiver and corner tasks during football with least risk of re-injury.    Goal status: MET       TODAY'S TREATMENT:    01/30/22 -Shoulder/Scapular Strengthening -Body blade, 30 sec, elbow in flexion, elbow in extension, forward flexion, abduction, no rest break between positions  -Overhead weighted walk: 3x45", 8# -[employer], 2x10, 8# -Sidelying IR/er, towel for slight abduction, 8#  -Red theraband standing chest pull, 1x10 -Red theraband forward flexion with tension, adduction at top, 1x10 -Prone abduction/snow angels, palm up 0-90 degrees, palm down 90-180, 1x10 -Shoulder stabilization with exercise ball, 1 minute, pt instructed to hold ball in forward flexion and resist movement with therapist pushing ball in various directions  -Bent over fly, 2x10, 3 lbs  -Bilateral forward flexion and abduction to 90, 2x10, 3 lbs    01/28/22 -Manual Therapy: Trigger point targeting medial border of scapula and upper trapezius  -Shoulder/Scapular Strengthening -Body blade, 45 sec, elbow in flexion, elbow in extension, forward flexion, abduction  -Overhead weighted walk: 3x45", 8# -[employer], 2x10, 8# -Standing IR/er, towel for slight abduction, 8# -Standing at wall, abducted IR/er, 1x7, 1x5, 2# -Plank with crossing midline to retrieve 4# weight, 2x45 sec -Red theraband standing chest pull, 1x10 -Red theraband forward flexion with tension, adduction at top, 1x10 -UBE: level 2, 3' forward, 2' reverse    01/23/22 -Manual Therapy: Trigger point targeting medial border of scapula and upper trapezius  -Shoulder/Scapular Strengthening -1x30"  exercise ball stabilization. Pt holding exercise ball in forward flexion, 90 degrees, therapist moving ball in various directions with pt instructed to hold ball still   -2x10 standing IR/er 4#  -1x10 arnold press 4# -1x10 red theraband standing chest pull, forward flexion with tension -Body blade, 45 sec, elbow in flexion, elbow in extension, forward flexion, abduction     -Plank with crossing midline to retrieve 4# weight, 2x30 sec    PATIENT EDUCATION: Education details: Dumbbell shoulder  Person educated: Patient Education method: Explanation, Demonstration, and Handouts Education comprehension: verbalized understanding and returned demonstration     HOME EXERCISE PROGRAM: Eval: AA/ROM-flexion/abduction/horizontal abduction, A/ROM protraction/er, green theraputty grip strengthening 01/14/22: Red theraband scapular and  shoulder strengthening  01/30/22: Dumbbell strengthening    ASSESSMENT:   CLINICAL IMPRESSION: A: Pt presents for reassessment today with significant improvements noted with left shoulder ROM and strength, both WNL with no increased pain or discomfort with movement or resistive exercise. He continues to show improvements with form and endurance with all exercises, requiring minimal verbal cues from therapist. Pt reports being independent with HEP. Discussed the importance of continuing with shoulder strengthening and HEP to maintain shoulder health.    PLAN:   OT FREQUENCY: Discharge today   Mathews Robinsons, OTR/L [phone]  01/30/2022, 10:55 AM

## 2022-02-06 ENCOUNTER — Encounter: Payer: Self-pay | Admitting: Orthopedic Surgery

## 2022-02-06 ENCOUNTER — Ambulatory Visit (INDEPENDENT_AMBULATORY_CARE_PROVIDER_SITE_OTHER): Payer: Medicaid Other | Admitting: Orthopedic Surgery

## 2022-02-06 VITALS — Ht 70.0 in | Wt 141.6 lb

## 2022-02-06 DIAGNOSIS — S43005D Unspecified dislocation of left shoulder joint, subsequent encounter: Secondary | ICD-10-CM

## 2022-02-06 NOTE — Progress Notes (Signed)
Chief Complaint  Patient presents with   Shoulder Injury     F/u left shoulder dislocation     The patient has undergone physical therapy is improving he has slight discomfort on anterior stress test but no instability  No apprehension  Recommend he return to play the week of the [place] game noncontact to preprepared for contact the next week against [place]

## 2022-03-20 DIAGNOSIS — Z23 Encounter for immunization: Secondary | ICD-10-CM | POA: Diagnosis not present

## 2022-04-28 DIAGNOSIS — F411 Generalized anxiety disorder: Secondary | ICD-10-CM | POA: Diagnosis not present

## 2022-05-01 IMAGING — DX DG CERVICAL SPINE COMPLETE 4+V
5 series · 6 of 6 positions shown · non-contrast
Comparison: None

CLINICAL DATA: Football injury with neck pain radiating to the left
shoulder., initial encounter

EXAM:
CERVICAL SPINE - COMPLETE 4+ VIEW

[c-spine lat]
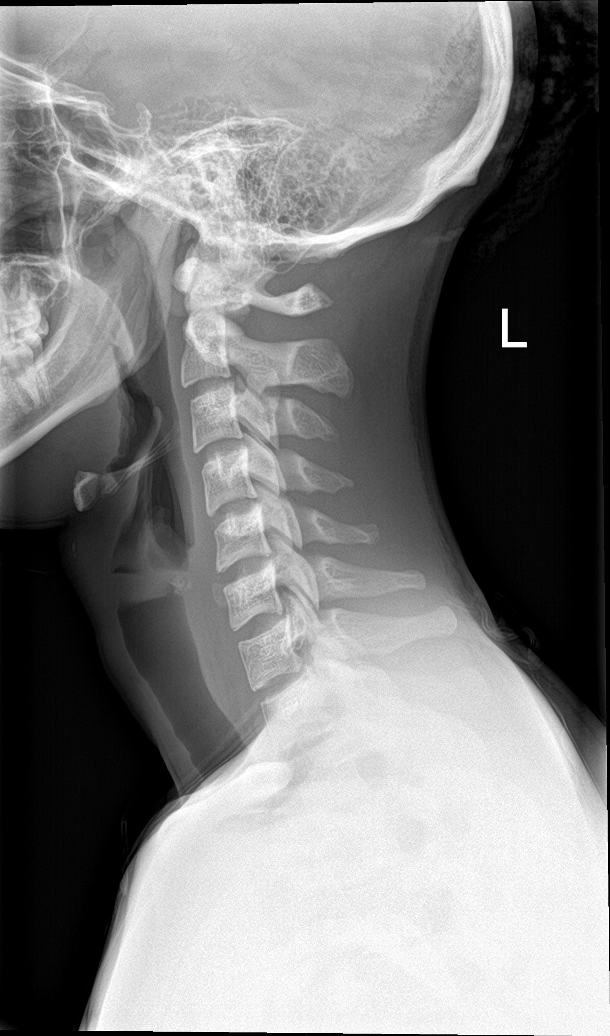

[c-spine obl (1 of 2)]
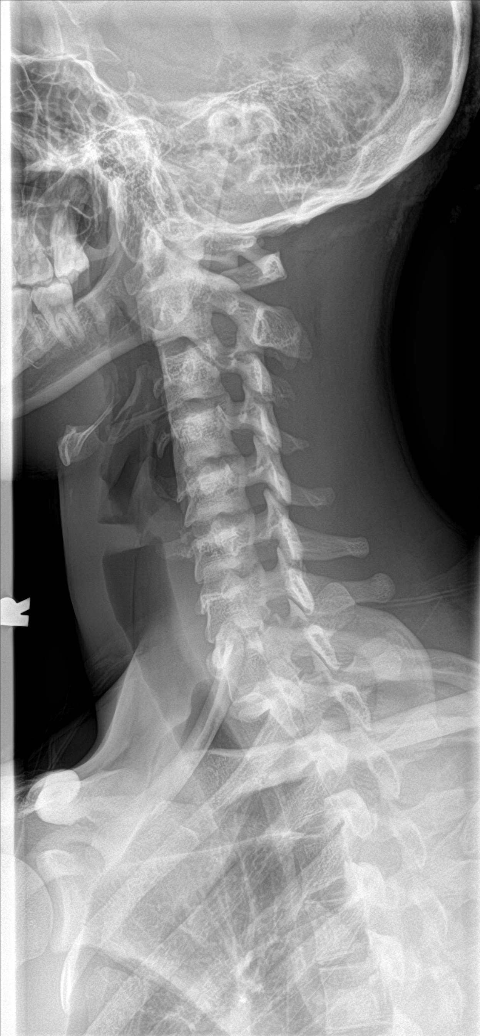

[c-spine obl (2 of 2)]
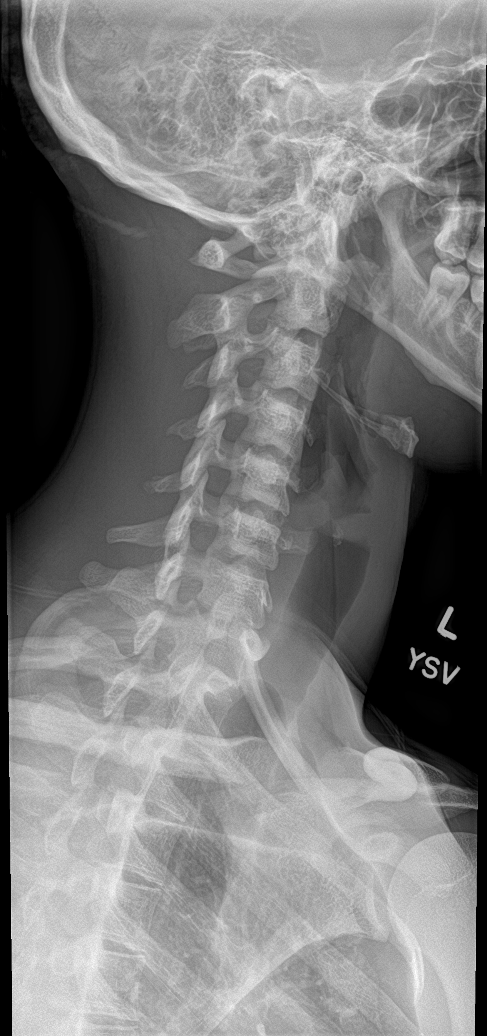

[c-spine ap]
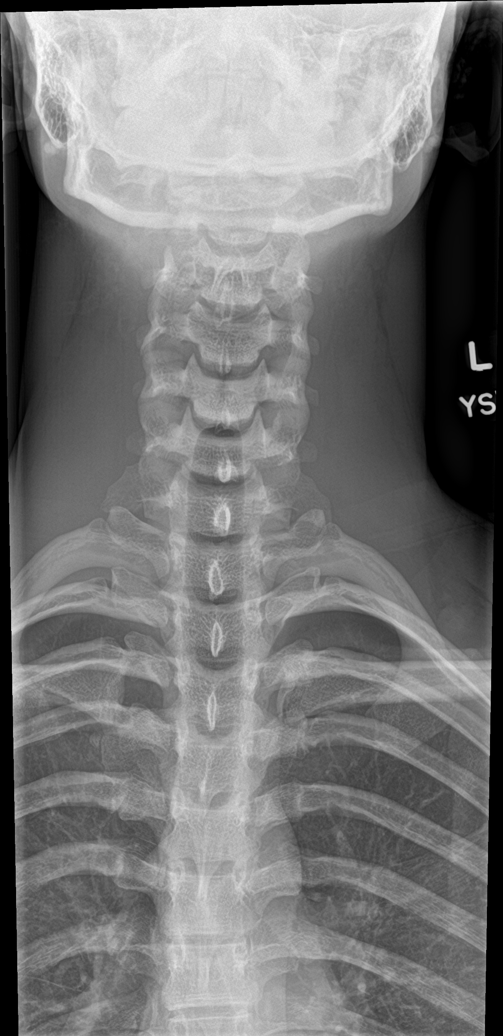

[Series 5: c-spine open mouth · 0.14mm/px · 2 of 2 slices shown]
[im 1/2]
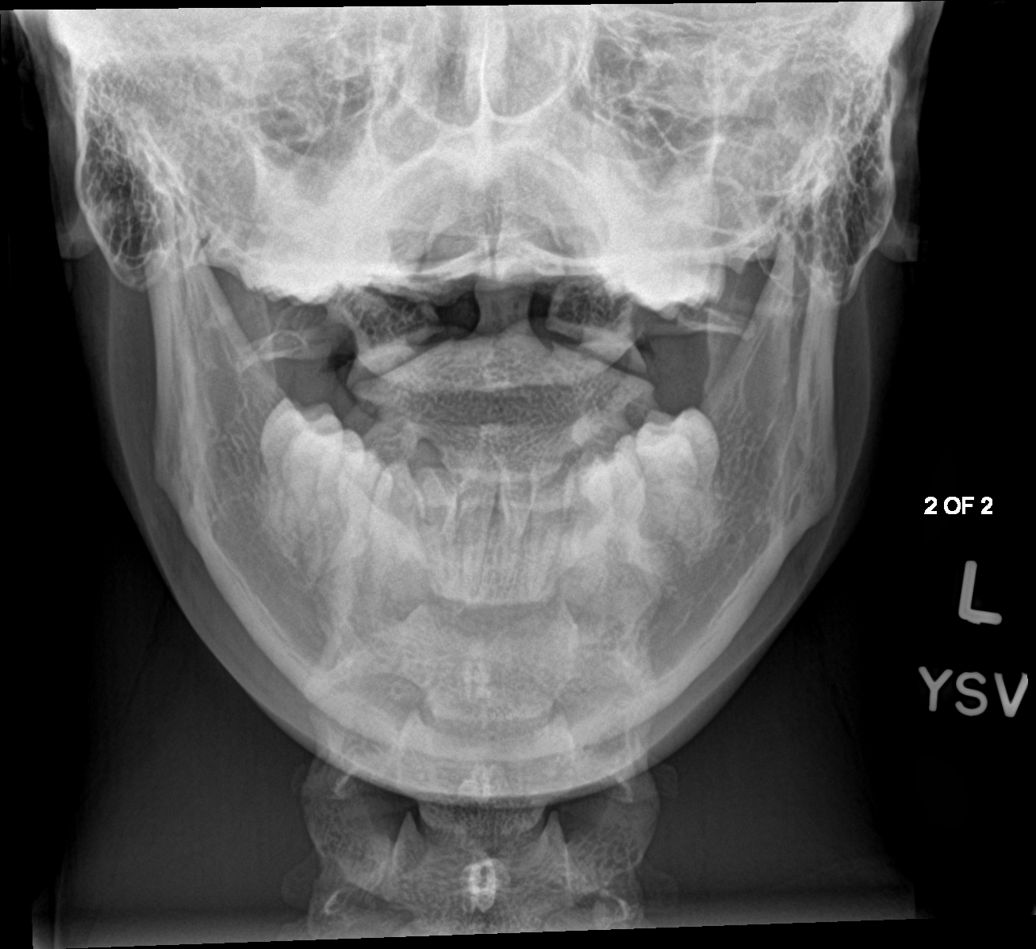
[im 2/2]
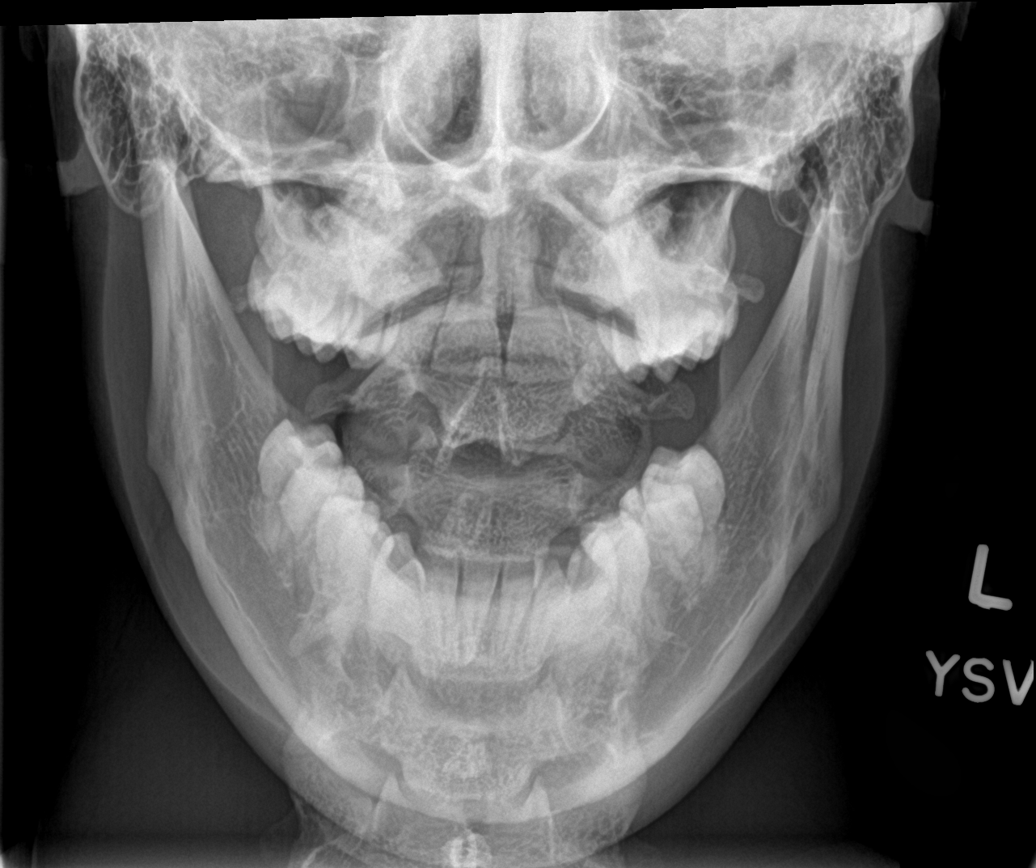

[6 of 6 positions shown; findings below may reference images not displayed]

FINDINGS: Seven cervical segments are well visualized. Alignment is well
maintained. No prevertebral soft tissue changes are noted. The
neural foramina are widely patent bilaterally. The odontoid is
within normal limits. No fracture or acute facet abnormality is
noted. No other focal abnormality is noted.
IMPRESSION: No acute abnormality seen.

## 2023-01-16 ENCOUNTER — Telehealth: Payer: Self-pay | Admitting: Radiology

## 2023-01-16 DIAGNOSIS — Z130 Encounter for screening for diseases of the blood and blood-forming organs and certain disorders involving the immune mechanism: Secondary | ICD-10-CM

## 2023-01-16 NOTE — Telephone Encounter (Signed)
Patient is in office today for a college physical Needs order for his sickle cell screen Order placed.
